# Patient Record
Sex: Male | Born: 1970 | Race: White | Hispanic: No | Marital: Single | Smoking: Never smoker
Health system: Southern US, Community
[De-identification: ages and names within clinical notes are randomized; demographics above are authoritative.]

## PROBLEM LIST (undated history)

## (undated) DIAGNOSIS — F418 Other specified anxiety disorders: Secondary | ICD-10-CM

## (undated) DIAGNOSIS — K649 Unspecified hemorrhoids: Secondary | ICD-10-CM

## (undated) DIAGNOSIS — G4452 New daily persistent headache (NDPH): Secondary | ICD-10-CM

## (undated) HISTORY — DX: Other specified anxiety disorders: F41.8

## (undated) HISTORY — DX: Unspecified hemorrhoids: K64.9

## (undated) HISTORY — DX: New daily persistent headache (ndph): G44.52

## (undated) HISTORY — PX: WISDOM TOOTH EXTRACTION: SHX21

## (undated) HISTORY — PX: LASIK: SHX215

---

## 2000-10-30 HISTORY — PX: LASIK: SHX215

## 2008-08-22 ENCOUNTER — Emergency Department (HOSPITAL_COMMUNITY): Admission: EM | Admit: 2008-08-22 | Discharge: 2008-08-22 | Payer: Self-pay | Admitting: Emergency Medicine

## 2015-04-19 ENCOUNTER — Other Ambulatory Visit: Payer: Self-pay | Admitting: Surgery

## 2015-04-19 NOTE — H&P (Signed)
Noah Collins 04/19/2015 8:40 AM Location: [facility] Patient #: [id number] DOB: May 20, 1971 Single / Language: [place] / Race: White Male History of Present Illness Ardeth Sportsman MD; 04/19/2015 9:22 AM) Patient words: post-op.  The patient is a 44 year old male who presents with anal lesions. Patient sent by his primary care physician, Dr. Beverley Fiedler, for concern of anal tag. Initially going to see Dr. Vida Rigger with [facility] gastroenterology but then redirected to [facility]  Pleasant active male. ? History of warts in the past per [facility] but pt denies this. History of hemorrhoids and fissure in the past. Feels mass near his anus x 6months. Intermittently prolapses - he has to force it back it. Bothersome to him. No severe bleeding. No help with preperation H this time (did in the past) Interested in having it removed. BMs daily. No severe constipation/diarrhea. No history of Crohn's or ulcerative colitis. No fevers or chills. Very sensitive back there. No severe pain. No history of anal fissure or fistula. No history of pilonidal disease. Other Problems Gilmer Mor, CMA; 04/19/2015 8:41 AM) Depression Hemorrhoids  Past Surgical History Gilmer Mor, CMA; 04/19/2015 8:41 AM) No pertinent past surgical history  Diagnostic Studies History Gilmer Mor, CMA; 04/19/2015 8:41 AM) Colonoscopy never  Allergies Lamar Laundry Bynum, CMA; 04/19/2015 8:41 AM) No Known Drug Allergies 04/19/2015  Medication History (Sonya Bynum, CMA; 04/19/2015 8:42 AM) Sertraline HCl (  Tablet, Oral) Active. Medications Reconciled  Social History Gilmer Mor, CMA; 04/19/2015 8:41 AM) Alcohol use Occasional alcohol use. Caffeine use Coffee. No drug use Tobacco use Never smoker.  Family History Gilmer Mor, CMA; 04/19/2015 8:41 AM) First Degree Relatives No pertinent family history     Review of Systems Lamar Laundry Bynum CMA; 04/19/2015 8:41 AM) General Not  Present- Appetite Loss, Chills, Fatigue, Fever, Night Sweats, Weight Gain and Weight Loss. Skin Not Present- Change in Wart/Mole, Dryness, Hives, Jaundice, New Lesions, Non-Healing Wounds, Rash and Ulcer. HEENT Not Present- Earache, Hearing Loss, Hoarseness, Nose Bleed, Oral Ulcers, Ringing in the Ears, Seasonal Allergies, Sinus Pain, Sore Throat, Visual Disturbances, Wears glasses/contact lenses and Yellow Eyes. Respiratory Not Present- Bloody sputum, Chronic Cough, Difficulty Breathing, Snoring and Wheezing. Breast Not Present- Breast Mass, Breast Pain, Nipple Discharge and Skin Changes. Cardiovascular Not Present- Chest Pain, Difficulty Breathing Lying Down, Leg Cramps, Palpitations, Rapid Heart Rate, Shortness of Breath and Swelling of Extremities. Gastrointestinal Present- Hemorrhoids. Not Present- Abdominal Pain, Bloating, Bloody Stool, Change in Bowel Habits, Chronic diarrhea, Constipation, Difficulty Swallowing, Excessive gas, Gets full quickly at meals, Indigestion, Nausea, Rectal Pain and Vomiting. Male Genitourinary Not Present- Blood in Urine, Change in Urinary Stream, Frequency, Impotence, Nocturia, Painful Urination, Urgency and Urine Leakage. Musculoskeletal Not Present- Back Pain, Joint Pain, Joint Stiffness, Muscle Pain, Muscle Weakness and Swelling of Extremities. Neurological Not Present- Decreased Memory, Fainting, Headaches, Numbness, Seizures, Tingling, Tremor, Trouble walking and Weakness. Psychiatric Present- Depression. Not Present- Anxiety, Bipolar, Change in Sleep Pattern, Fearful and Frequent crying. Endocrine Not Present- Cold Intolerance, Excessive Hunger, Hair Changes, Heat Intolerance, Hot flashes and New Diabetes. Hematology Not Present- Easy Bruising, Excessive bleeding, Gland problems, HIV and Persistent Infections.  Vitals (Sonya Bynum CMA; 04/19/2015 8:41 AM) 04/19/2015 8:41 AM Weight: 160 lb Height: 68in Body Surface Area: 1.87 m Body Mass Index: 24.33  kg/m Temp.: 64F(Temporal)  Pulse: 76 (Regular)  BP: 124/74 (Sitting, Left Arm, Standard)     Physical Exam Ardeth Sportsman MD; 04/19/2015 9:14 AM)  General Mental Status-Alert. General Appearance-Not in acute distress, Not Sickly.  Orientation-Oriented X3. Hydration-Well hydrated. Voice-Normal.  Integumentary Global Assessment Upon inspection and palpation of skin surfaces of the - Axillae: non-tender, no inflammation or ulceration, no drainage. and Distribution of scalp and body hair is normal. General Characteristics Temperature - normal warmth is noted.  Head and Neck Head-normocephalic, atraumatic with no lesions or palpable masses. Face Global Assessment - atraumatic, no absence of expression. Neck Global Assessment - no abnormal movements, no bruit auscultated on the right, no bruit auscultated on the left, no decreased range of motion, non-tender. Trachea-midline. Thyroid Gland Characteristics - non-tender.  Eye Eyeball - Left-Extraocular movements intact, No Nystagmus. Eyeball - Right-Extraocular movements intact, No Nystagmus. Cornea - Left-No Hazy. Cornea - Right-No Hazy. Sclera/Conjunctiva - Left-No scleral icterus, No Discharge. Sclera/Conjunctiva - Right-No scleral icterus, No Discharge. Pupil - Left-Direct reaction to light normal. Pupil - Right-Direct reaction to light normal.  ENMT Ears Pinna - Left - no drainage observed, no generalized tenderness observed. Right - no drainage observed, no generalized tenderness observed. Nose and Sinuses External Inspection of the Nose - no destructive lesion observed. Inspection of the nares - Left - quiet respiration. Right - quiet respiration. Mouth and Throat Lips - Upper Lip - no fissures observed, no pallor noted. Lower Lip - no fissures observed, no pallor noted. Nasopharynx - no discharge present. Oral Cavity/Oropharynx - Tongue - no dryness observed. Oral Mucosa - no  cyanosis observed. Hypopharynx - no evidence of airway distress observed.  Chest and Lung Exam Inspection Movements - Normal and Symmetrical. Accessory muscles - No use of accessory muscles in breathing. Palpation Palpation of the chest reveals - Non-tender. Auscultation Breath sounds - Normal and Clear.  Cardiovascular Auscultation Rhythm - Regular. Murmurs & Other Heart Sounds - Auscultation of the heart reveals - No Murmurs and No Systolic Clicks.  Abdomen Inspection Inspection of the abdomen reveals - No Visible peristalsis and No Abnormal pulsations. Umbilicus - No Bleeding, No Urine drainage. Palpation/Percussion Palpation and Percussion of the abdomen reveal - Soft, Non Tender, No Rebound tenderness, No Rigidity (guarding) and No Cutaneous hyperesthesia. Note: Soft and flat. No diastases. No umbilical hernia   Male Genitourinary Sexual Maturity Tanner 5 - Adult hair pattern and Adult penile size and shape. Note: Perianal skin clean. No pruritis. Mild hair burden. No pilonidal disease. Subtle posterior midline anal scar but no definite fissure. RIGHT lateral anal fold. Obvious right posterior pedunculauted mass on digital exam and seen on anoscopy consistent with anal canal polyp. Can prolapse easily. Mild grade 2 hemorrhoids right anterior & left lateral   Peripheral Vascular Upper Extremity Inspection - Left - No Cyanotic nailbeds, Not Ischemic. Right - No Cyanotic nailbeds, Not Ischemic.  Neurologic Neurologic evaluation reveals -normal attention span and ability to concentrate, able to name objects and repeat phrases. Appropriate fund of knowledge , normal sensation and normal coordination. Mental Status Affect - not angry, not paranoid. Cranial Nerves-Normal Bilaterally. Gait-Normal.  Neuropsychiatric Mental status exam performed with findings of-able to articulate well with normal speech/language, rate, volume and coherence, thought content normal with  ability to perform basic computations and apply abstract reasoning and no evidence of hallucinations, delusions, obsessions or homicidal/suicidal ideation.  Musculoskeletal Global Assessment Spine, Ribs and Pelvis - no instability, subluxation or laxity. Right Upper Extremity - no instability, subluxation or laxity.  Lymphatic Head & Neck  General Head & Neck Lymphatics: Bilateral - Description - No Localized lymphadenopathy. Axillary  General Axillary Region: Bilateral - Description - No Localized lymphadenopathy. Femoral & Inguinal  Generalized Femoral &  Inguinal Lymphatics: Left - Description - No Localized lymphadenopathy. Right - Description - No Localized lymphadenopathy.    Assessment & Plan Ardeth Sportsman MD; 04/19/2015 9:22 AM)  ANAL POLYP (569.0  K62.0) Impression: Anal canal mass prolapsing out that requires been pushed back in. Grade 3. Sensitive and causing discomfort. I think he would benefit from removal. I think he is far too sensitive to try and remove the office. We'll plan outpatient procedure under anesthesia. Prone.  Current Plans Schedule for Surgery Pt Education - [facility] Hemorrhoids (Danell Verno) Pt Education - [facility] Rectal Surgery HCI (Sarai January): discussed with patient and provided information. Pt Education - [facility] Pelvic Floor Exercises (Kegels) and Dysfunction HCI (Keedan Sample) Pt Education - [facility] Pain Control (Alaa Mullally) The anatomy & physiology of the anorectal region was discussed. The pathophysiology of hemorrhoids and differential diagnosis was discussed. Natural history risks without surgery was discussed. I stressed the importance of a bowel regimen to have daily soft bowel movements to minimize progression of disease. Interventions such as sclerotherapy & banding were discussed.  The patient's symptoms are not adequately controlled by medicines and other non-operative treatments. I feel the risks & problems of no surgery outweigh the operative risks; therefore, I recommended  surgery to treat the hemorrhoids by ligation, pexy, and possible resection.  Risks such as bleeding, infection, urinary difficulties, need for further treatment, heart attack, death, and other risks were discussed. I noted a good likelihood this will help address the problem. Goals of post-operative recovery were discussed as well. Possibility that this will not correct all symptoms was explained. Post-operative pain, bleeding, constipation, and other problems after surgery were discussed. We will work to minimize complications. Educational handouts further explaining the pathology, treatment options, and bowel regimen were given as well. Questions were answered. The patient expresses understanding & wishes to proceed with surgery.  Ardeth Sportsman, M.D., F.A.C.S. Gastrointestinal and Minimally Invasive Surgery [facility] [address] [place], [place] [address] [phone] Main / Paging

## 2016-01-31 DIAGNOSIS — S63285D Dislocation of proximal interphalangeal joint of left ring finger, subsequent encounter: Secondary | ICD-10-CM | POA: Diagnosis not present

## 2016-01-31 DIAGNOSIS — M79645 Pain in left finger(s): Secondary | ICD-10-CM | POA: Diagnosis not present

## 2016-01-31 DIAGNOSIS — M24542 Contracture, left hand: Secondary | ICD-10-CM | POA: Diagnosis not present

## 2016-02-07 DIAGNOSIS — M24542 Contracture, left hand: Secondary | ICD-10-CM | POA: Diagnosis not present

## 2016-02-07 DIAGNOSIS — S63285D Dislocation of proximal interphalangeal joint of left ring finger, subsequent encounter: Secondary | ICD-10-CM | POA: Diagnosis not present

## 2016-02-07 DIAGNOSIS — M79645 Pain in left finger(s): Secondary | ICD-10-CM | POA: Diagnosis not present

## 2016-02-11 DIAGNOSIS — S63285S Dislocation of proximal interphalangeal joint of left ring finger, sequela: Secondary | ICD-10-CM | POA: Diagnosis not present

## 2016-02-11 DIAGNOSIS — S63285D Dislocation of proximal interphalangeal joint of left ring finger, subsequent encounter: Secondary | ICD-10-CM | POA: Diagnosis not present

## 2016-02-11 DIAGNOSIS — M24542 Contracture, left hand: Secondary | ICD-10-CM | POA: Diagnosis not present

## 2016-02-18 DIAGNOSIS — S63285S Dislocation of proximal interphalangeal joint of left ring finger, sequela: Secondary | ICD-10-CM | POA: Diagnosis not present

## 2016-02-18 DIAGNOSIS — M24542 Contracture, left hand: Secondary | ICD-10-CM | POA: Diagnosis not present

## 2016-02-24 DIAGNOSIS — S63285D Dislocation of proximal interphalangeal joint of left ring finger, subsequent encounter: Secondary | ICD-10-CM | POA: Diagnosis not present

## 2016-02-24 DIAGNOSIS — M24542 Contracture, left hand: Secondary | ICD-10-CM | POA: Diagnosis not present

## 2016-02-24 DIAGNOSIS — S63285S Dislocation of proximal interphalangeal joint of left ring finger, sequela: Secondary | ICD-10-CM | POA: Diagnosis not present

## 2016-02-24 DIAGNOSIS — M79645 Pain in left finger(s): Secondary | ICD-10-CM | POA: Diagnosis not present

## 2016-03-02 DIAGNOSIS — S63285D Dislocation of proximal interphalangeal joint of left ring finger, subsequent encounter: Secondary | ICD-10-CM | POA: Diagnosis not present

## 2016-03-02 DIAGNOSIS — S63285S Dislocation of proximal interphalangeal joint of left ring finger, sequela: Secondary | ICD-10-CM | POA: Diagnosis not present

## 2016-03-02 DIAGNOSIS — M79645 Pain in left finger(s): Secondary | ICD-10-CM | POA: Diagnosis not present

## 2016-03-02 DIAGNOSIS — M24542 Contracture, left hand: Secondary | ICD-10-CM | POA: Diagnosis not present

## 2016-03-13 DIAGNOSIS — S63285S Dislocation of proximal interphalangeal joint of left ring finger, sequela: Secondary | ICD-10-CM | POA: Diagnosis not present

## 2016-03-13 DIAGNOSIS — M24542 Contracture, left hand: Secondary | ICD-10-CM | POA: Diagnosis not present

## 2016-03-21 DIAGNOSIS — M25642 Stiffness of left hand, not elsewhere classified: Secondary | ICD-10-CM | POA: Diagnosis not present

## 2016-03-31 DIAGNOSIS — F418 Other specified anxiety disorders: Secondary | ICD-10-CM | POA: Diagnosis not present

## 2016-04-25 DIAGNOSIS — M24542 Contracture, left hand: Secondary | ICD-10-CM | POA: Diagnosis not present

## 2016-04-25 DIAGNOSIS — S63285S Dislocation of proximal interphalangeal joint of left ring finger, sequela: Secondary | ICD-10-CM | POA: Diagnosis not present

## 2016-07-24 DIAGNOSIS — N529 Male erectile dysfunction, unspecified: Secondary | ICD-10-CM | POA: Diagnosis not present

## 2016-08-21 DIAGNOSIS — Z23 Encounter for immunization: Secondary | ICD-10-CM | POA: Diagnosis not present

## 2016-08-21 DIAGNOSIS — L729 Follicular cyst of the skin and subcutaneous tissue, unspecified: Secondary | ICD-10-CM | POA: Diagnosis not present

## 2016-10-18 DIAGNOSIS — L723 Sebaceous cyst: Secondary | ICD-10-CM | POA: Diagnosis not present

## 2016-10-18 DIAGNOSIS — Z302 Encounter for sterilization: Secondary | ICD-10-CM | POA: Diagnosis not present

## 2016-10-30 HISTORY — PX: VASECTOMY: SHX75

## 2016-10-30 HISTORY — PX: SHOULDER ARTHROSCOPY: SHX128

## 2016-11-22 DIAGNOSIS — Z302 Encounter for sterilization: Secondary | ICD-10-CM | POA: Diagnosis not present

## 2016-11-22 DIAGNOSIS — L723 Sebaceous cyst: Secondary | ICD-10-CM | POA: Diagnosis not present

## 2016-11-27 DIAGNOSIS — F4323 Adjustment disorder with mixed anxiety and depressed mood: Secondary | ICD-10-CM | POA: Diagnosis not present

## 2017-01-05 DIAGNOSIS — Z0000 Encounter for general adult medical examination without abnormal findings: Secondary | ICD-10-CM | POA: Diagnosis not present

## 2017-01-17 DIAGNOSIS — R740 Nonspecific elevation of levels of transaminase and lactic acid dehydrogenase [LDH]: Secondary | ICD-10-CM | POA: Diagnosis not present

## 2017-02-28 DIAGNOSIS — R3915 Urgency of urination: Secondary | ICD-10-CM | POA: Diagnosis not present

## 2017-03-02 DIAGNOSIS — R3915 Urgency of urination: Secondary | ICD-10-CM | POA: Diagnosis not present

## 2017-03-22 DIAGNOSIS — M19011 Primary osteoarthritis, right shoulder: Secondary | ICD-10-CM | POA: Diagnosis not present

## 2017-05-14 DIAGNOSIS — M19011 Primary osteoarthritis, right shoulder: Secondary | ICD-10-CM | POA: Diagnosis not present

## 2017-06-05 DIAGNOSIS — M7521 Bicipital tendinitis, right shoulder: Secondary | ICD-10-CM | POA: Diagnosis not present

## 2017-06-05 DIAGNOSIS — M24111 Other articular cartilage disorders, right shoulder: Secondary | ICD-10-CM | POA: Diagnosis not present

## 2017-06-05 DIAGNOSIS — G8918 Other acute postprocedural pain: Secondary | ICD-10-CM | POA: Diagnosis not present

## 2017-06-05 DIAGNOSIS — M7541 Impingement syndrome of right shoulder: Secondary | ICD-10-CM | POA: Diagnosis not present

## 2017-06-05 DIAGNOSIS — S43431A Superior glenoid labrum lesion of right shoulder, initial encounter: Secondary | ICD-10-CM | POA: Diagnosis not present

## 2017-06-05 DIAGNOSIS — I890 Lymphedema, not elsewhere classified: Secondary | ICD-10-CM | POA: Diagnosis not present

## 2017-06-05 DIAGNOSIS — M19011 Primary osteoarthritis, right shoulder: Secondary | ICD-10-CM | POA: Diagnosis not present

## 2017-06-05 DIAGNOSIS — M25511 Pain in right shoulder: Secondary | ICD-10-CM | POA: Diagnosis not present

## 2017-07-04 DIAGNOSIS — Q559 Congenital malformation of male genital organ, unspecified: Secondary | ICD-10-CM | POA: Diagnosis not present

## 2017-07-05 DIAGNOSIS — R29898 Other symptoms and signs involving the musculoskeletal system: Secondary | ICD-10-CM | POA: Diagnosis not present

## 2018-01-04 DIAGNOSIS — H6590 Unspecified nonsuppurative otitis media, unspecified ear: Secondary | ICD-10-CM | POA: Diagnosis not present

## 2018-01-04 DIAGNOSIS — Z0000 Encounter for general adult medical examination without abnormal findings: Secondary | ICD-10-CM | POA: Diagnosis not present

## 2018-02-19 DIAGNOSIS — L281 Prurigo nodularis: Secondary | ICD-10-CM | POA: Diagnosis not present

## 2018-02-19 DIAGNOSIS — B070 Plantar wart: Secondary | ICD-10-CM | POA: Diagnosis not present

## 2018-02-19 DIAGNOSIS — F418 Other specified anxiety disorders: Secondary | ICD-10-CM | POA: Diagnosis not present

## 2018-03-19 DIAGNOSIS — M216X2 Other acquired deformities of left foot: Secondary | ICD-10-CM | POA: Diagnosis not present

## 2018-04-03 DIAGNOSIS — L708 Other acne: Secondary | ICD-10-CM | POA: Diagnosis not present

## 2018-06-07 DIAGNOSIS — L708 Other acne: Secondary | ICD-10-CM | POA: Diagnosis not present

## 2018-07-08 DIAGNOSIS — K4090 Unilateral inguinal hernia, without obstruction or gangrene, not specified as recurrent: Secondary | ICD-10-CM | POA: Diagnosis not present

## 2018-07-08 DIAGNOSIS — R102 Pelvic and perineal pain: Secondary | ICD-10-CM | POA: Diagnosis not present

## 2018-07-18 DIAGNOSIS — F418 Other specified anxiety disorders: Secondary | ICD-10-CM | POA: Diagnosis not present

## 2018-07-30 DIAGNOSIS — F4323 Adjustment disorder with mixed anxiety and depressed mood: Secondary | ICD-10-CM | POA: Diagnosis not present

## 2018-08-05 DIAGNOSIS — Z9889 Other specified postprocedural states: Secondary | ICD-10-CM | POA: Diagnosis not present

## 2018-08-14 DIAGNOSIS — F4323 Adjustment disorder with mixed anxiety and depressed mood: Secondary | ICD-10-CM | POA: Diagnosis not present

## 2018-08-15 DIAGNOSIS — F418 Other specified anxiety disorders: Secondary | ICD-10-CM | POA: Diagnosis not present

## 2018-08-21 DIAGNOSIS — F4323 Adjustment disorder with mixed anxiety and depressed mood: Secondary | ICD-10-CM | POA: Diagnosis not present

## 2018-08-24 ENCOUNTER — Emergency Department (HOSPITAL_COMMUNITY)
Admission: EM | Admit: 2018-08-24 | Discharge: 2018-08-24 | Disposition: A | Discharge status: Home / Self Care | Payer: BLUE CROSS/BLUE SHIELD | Attending: Emergency Medicine | Admitting: Emergency Medicine

## 2018-08-24 ENCOUNTER — Encounter (HOSPITAL_COMMUNITY): Payer: Self-pay | Admitting: Emergency Medicine

## 2018-08-24 DIAGNOSIS — Z9889 Other specified postprocedural states: Secondary | ICD-10-CM

## 2018-08-24 DIAGNOSIS — H539 Unspecified visual disturbance: Secondary | ICD-10-CM | POA: Diagnosis not present

## 2018-08-24 DIAGNOSIS — G8918 Other acute postprocedural pain: Secondary | ICD-10-CM | POA: Insufficient documentation

## 2018-08-24 DIAGNOSIS — H5712 Ocular pain, left eye: Secondary | ICD-10-CM | POA: Diagnosis not present

## 2018-08-24 MED ORDER — KETOROLAC TROMETHAMINE 60 MG/2ML IM SOLN
60.0000 mg | Freq: Once | INTRAMUSCULAR | Status: AC
  Administered 2018-08-24: 60 mg via INTRAMUSCULAR
  Filled 2018-08-24: qty 2

## 2018-08-24 MED ORDER — OXYCODONE HCL 5 MG PO TABS
5.0000 mg | ORAL_TABLET | Freq: Once | ORAL | Status: AC
  Administered 2018-08-24: 5 mg via ORAL
  Filled 2018-08-24: qty 1

## 2018-08-24 MED ORDER — TETRACAINE HCL 0.5 % OP SOLN
2.0000 [drp] | Freq: Once | OPHTHALMIC | Status: AC
  Administered 2018-08-24: 2 [drp] via OPHTHALMIC
  Filled 2018-08-24: qty 4

## 2018-08-24 MED ORDER — OXYCODONE HCL 5 MG PO TABS: 5.0000 mg | ORAL_TABLET | ORAL | 0 refills | Status: DC | PRN

## 2018-08-24 NOTE — ED Triage Notes (Signed)
Pt reports that he had Lasik eye surgery on Thursday and had a contact lens put in for 5 day but today contact came out and now having sharp stabbing pains and reports cant see out of it.

## 2018-08-24 NOTE — ED Provider Notes (Signed)
[facility]-EMERGENCY DEPT Provider Note   CSN: [id number] Arrival date & time: 08/24/18  1818     History   Chief Complaint Chief Complaint  Patient presents with  . Eye Pain    HPI Stacie Knutzen is a 47 y.o. male.  HPI   Thursday had retouch surgery, lasik Dr. Delaney Meigs  Contact lens to protect it, since it was removed having severe pain, feels like a needle in eye, blurred vision  10/10 pain   History reviewed. No pertinent past medical history.  There are no active problems to display for this patient.   Past Surgical History:  Procedure Laterality Date  . LASIK          Home Medications    Prior to Admission medications   Medication Sig Start Date End Date Taking? Authorizing Provider  Ascorbic Acid (VITAMIN C GUMMIES PO) Take 1 tablet by mouth daily.   Yes [provider]  citalopram (CELEXA) 20 MG tablet Take 20 mg by mouth daily. 08/08/18  Yes [provider]  fluorometholone (FML) 0.1 % ophthalmic suspension Place 1 drop into the left eye 4 (four) times daily. 08/21/18  Yes [provider]  gatifloxacin (ZYMAXID) 0.5 % SOLN Place 1 drop into the left eye 3 (three) times daily. 08/21/18  Yes [provider]  Multiple Vitamin (MULTIVITAMIN WITH MINERALS) TABS tablet Take 1 tablet by mouth daily.   Yes [provider]  oxyCODONE-acetaminophen (PERCOCET/ROXICET) 5-325 MG tablet Take 1 tablet by mouth every 4 (four) hours as needed for pain. 08/21/18  Yes [provider]  zolpidem (AMBIEN) 10 MG tablet Take 10 mg by mouth at bedtime. 08/21/18  Yes [provider]  oxyCODONE (ROXICODONE) 5 MG immediate release tablet Take 1-2 tablets (5-10 mg total) by mouth every 4 (four) hours as needed for severe pain. 08/24/18   Alvira Monday, MD    Family History No family history on file.  Social History Social History   Tobacco Use  . Smoking status: Never Smoker  . Smokeless  tobacco: Never Used  Substance Use Topics  . Alcohol use: Not Currently  . Drug use: Not Currently     Allergies   Patient has no known allergies.   Review of Systems Review of Systems  Constitutional: Negative for fever.  HENT: Negative for sore throat.   Eyes: Positive for pain and visual disturbance. Negative for photophobia.  Gastrointestinal: Negative for nausea and vomiting.  Skin: Negative for rash.  Neurological: Negative for syncope and headaches.     Physical Exam Updated Vital Signs BP 119/76   Pulse 68   Temp 98 F (36.7 C) (Oral)   Resp 18   Ht 5\' 9"  (1.753 m)   Wt 72.6 kg   SpO2 97%   BMI 23.63 kg/m   Physical Exam  Constitutional: He is oriented to person, place, and time. He appears well-developed and well-nourished. He appears distressed.  HENT:  Head: Normocephalic and atraumatic.  Eyes: Pupils are equal, round, and reactive to light. EOM are normal. Left eye discharge: tearing. Left conjunctiva is injected.  Neck: Normal range of motion.  Cardiovascular: Normal rate, regular rhythm and intact distal pulses.  Pulmonary/Chest: Effort normal. No respiratory distress.  Abdominal: Soft. He exhibits no distension. There is no tenderness. There is no guarding.  Musculoskeletal: He exhibits no edema.  Neurological: He is alert and oriented to person, place, and time.  Skin: Skin is warm and dry. He is not diaphoretic.  Nursing  note and vitals reviewed.    ED Treatments / Results  Labs (all labs ordered are listed, but only abnormal results are displayed) Labs Reviewed - No data to display  EKG None  Radiology No results found.  Procedures Procedures (including critical care time)  Medications Ordered in ED Medications  tetracaine (PONTOCAINE) 0.5 % ophthalmic solution 2 drop (2 drops Left Eye Given 08/24/18 1846)  tetracaine (PONTOCAINE) 0.5 % ophthalmic solution 2 drop (2 drops Left Eye Given 08/24/18 2114)  ketorolac (TORADOL)  injection 60 mg (60 mg Intramuscular Given 08/24/18 2203)  oxyCODONE (Oxy IR/ROXICODONE) immediate release tablet 5 mg (5 mg Oral Given 08/24/18 2344)     Initial Impression / Assessment and Plan / ED Course  I have reviewed the triage vital signs and the nursing notes.  Pertinent labs & imaging results that were available during my care of the patient were reviewed by me and considered in my medical decision making (see chart for details).     47 year old male with a history of Lasik, suspect photoreactive keratectomy with Dr. Delaney Meigs on Thursday, who presents with severe, stabbing left eye pain and blurred vision after he removes bandage contact lens that had begun to fall out of his eye.  Called ophthalmology, on-call physician for Dr. Ronnie Doss office, and we were initially told it would be potentially greater than 2 hours before you hear back, then discussed with ophthalmology on-call for [facility], however Dr. Clista Bernhardt or Dr. Ronnie Doss office, called back and reports he will get in touch with Dr. Delaney Meigs tonight for placement of bandage contact lens in the morning.  The pain after removal of the bandage contact lens is expected, and is severe.  He recommends not high pain medication to use throughout the night.  In the emergency department, he was given Toradol, tetracaine, and 1 dose of oxycodone prior to discharge.  Provided patient with Dr. Ceasar Lund number.  Final Clinical Impressions(s) / ED Diagnoses   Final diagnoses:  Status post advanced surface ablation photorefractive keratectomy [facility])    ED Discharge Orders         Ordered    oxyCODONE (ROXICODONE) 5 MG immediate release tablet  Every 4 hours PRN     08/24/18 2338           Alvira Monday, MD 08/25/18 1437

## 2018-08-24 NOTE — ED Notes (Signed)
Pt given ice pack as requested

## 2018-09-05 ENCOUNTER — Ambulatory Visit (INDEPENDENT_AMBULATORY_CARE_PROVIDER_SITE_OTHER): Payer: BLUE CROSS/BLUE SHIELD | Admitting: Psychiatry

## 2018-09-05 ENCOUNTER — Encounter: Payer: Self-pay | Admitting: Psychiatry

## 2018-09-05 VITALS — BP 125/67 | HR 67

## 2018-09-05 DIAGNOSIS — F3289 Other specified depressive episodes: Secondary | ICD-10-CM

## 2018-09-05 DIAGNOSIS — F419 Anxiety disorder, unspecified: Secondary | ICD-10-CM

## 2018-09-05 MED ORDER — MIRTAZAPINE 15 MG PO TABS: 15.0000 mg | ORAL_TABLET | Freq: Every day | ORAL | 1 refills | Status: DC

## 2018-09-05 NOTE — Progress Notes (Signed)
[facility] MD/PA/NP Initial Note  09/05/2018 1:01 PM Noah Collins  MRN:  [id number]  Chief Complaint:  depression   HPI: Patient is a 47 year old white male.  Seen today for depression anxiety.  Patient's depression started 5 years ago.  It started after his divorce. he was okay in 3 to 6 months.  He had intermittent depression lasting several days since then.  This past August his girlfriend left him and it was  the worst depression she is ever had. he had lost weight decreased appetite decreased exercise insomnia decreased energy and motivation and anhedonia, hard to get out of bed.  He did have passive suicidal thoughts at that time it is the only time he is ever had it.  No suicidal plan.  Currently he is doing much better but still has waves of depression lasting several days at a time.  Denies manic symptoms.  No psychosis.  No OCD.  Anxiety intermittently.  Visit Diagnosis: depression, anxiety  Past Psychiatric History:   Past Medical History: negative Past Surgical History:  Procedure Laterality Date  . LASIK      Family Psychiatric History: negative  Family History: negative  Social History:  Divorced with a 8 yr. Old daughter. He doesn't smoke, drink etoh, and doesn;t use drugs  Allergies: No Known Allergies  Metabolic Disorder Labs: No results found for: HGBA1C, MPG No results found for: PROLACTIN No results found for: CHOL, TRIG, HDL, CHOLHDL, VLDL, LDLCALC No results found for: TSH  Therapeutic Level Labs: No results found for: LITHIUM No results found for: VALPROATE No components found for:  CBMZ  Current Medications: Current Outpatient Medications  Medication Sig Dispense Refill  . Ascorbic Acid (VITAMIN C GUMMIES PO) Take 1 tablet by mouth daily.    . citalopram (CELEXA) 20 MG tablet Take 20 mg by mouth daily.  1  . fluorometholone (FML) 0.1 % ophthalmic suspension Place 1 drop into the left eye 4 (four) times daily.  2  . gatifloxacin (ZYMAXID) 0.5 %  SOLN Place 1 drop into the left eye 3 (three) times daily.  0  . Multiple Vitamin (MULTIVITAMIN WITH MINERALS) TABS tablet Take 1 tablet by mouth daily.    Marland Kitchen oxyCODONE (ROXICODONE) 5 MG immediate release tablet Take 1-2 tablets (5-10 mg total) by mouth every 4 (four) hours as needed for severe pain. 5 tablet 0  . oxyCODONE-acetaminophen (PERCOCET/ROXICET) 5-325 MG tablet Take 1 tablet by mouth every 4 (four) hours as needed for pain.  0  . zolpidem (AMBIEN) 10 MG tablet Take 10 mg by mouth at bedtime.  0   No current facility-administered medications for this visit.     Medication Side Effects: none  Orders placed this visit:  Increase celexa to 30 mg/day. Continue remeron 15 mg/hs. Xanax 0.25 1-2 prn. 30 tablets given.  Psychiatric Specialty Exam:  ROS negative  Wt 162 lbs.  Height 5'9"  General Appearance: Neat  Eye Contact:  Good  Speech:  Clear and Coherent  Volume:  Normal  Mood:  Euthymic  Affect:  Appropriate  Thought Process:  Linear  Orientation:  Full (Time, Place, and Person)  Thought Content: Logical   Suicidal Thoughts:  No  Homicidal Thoughts:  No  Memory:  normal  Judgement:  Good  Insight:  Good  Psychomotor Activity:  Normal  Concentration:  Concentration: Good  Recall:  Good  Fund of Knowledge: Good  Language: Good  Assets:  Desire for Improvement  ADL's:  Intact  Cognition: WNL  Prognosis:  Good   Screenings: mdq-negative  Receiving Psychotherapy: No  had been seeing Engineer, manufacturing systems  Treatment Plan/Recommendations: Is to increase his Celexa to 30 mg a day.  Given a prescription for Remeron 15 mg a day.  Given a prescription for Xanax 0.25 mg 1-2 as needed 30 tablets given. Prescriptions were hand written.    Anne Fu, PA-C

## 2018-09-11 ENCOUNTER — Other Ambulatory Visit: Payer: Self-pay

## 2018-09-11 MED ORDER — MIRTAZAPINE 15 MG PO TABS: 15.0000 mg | ORAL_TABLET | Freq: Every day | ORAL | 0 refills | Status: DC

## 2018-10-03 ENCOUNTER — Ambulatory Visit: Payer: BLUE CROSS/BLUE SHIELD | Admitting: Psychiatry

## 2018-10-30 HISTORY — PX: KNEE ARTHROSCOPY: SUR90

## 2018-11-08 DIAGNOSIS — F418 Other specified anxiety disorders: Secondary | ICD-10-CM | POA: Diagnosis not present

## 2018-12-18 DIAGNOSIS — Z0000 Encounter for general adult medical examination without abnormal findings: Secondary | ICD-10-CM | POA: Diagnosis not present

## 2019-05-20 DIAGNOSIS — M1712 Unilateral primary osteoarthritis, left knee: Secondary | ICD-10-CM | POA: Diagnosis not present

## 2019-07-18 DIAGNOSIS — M25562 Pain in left knee: Secondary | ICD-10-CM | POA: Diagnosis not present

## 2019-07-24 DIAGNOSIS — S83242A Other tear of medial meniscus, current injury, left knee, initial encounter: Secondary | ICD-10-CM | POA: Diagnosis not present

## 2019-08-01 DIAGNOSIS — S83242A Other tear of medial meniscus, current injury, left knee, initial encounter: Secondary | ICD-10-CM | POA: Diagnosis not present

## 2019-08-01 DIAGNOSIS — M23352 Other meniscus derangements, posterior horn of lateral meniscus, left knee: Secondary | ICD-10-CM | POA: Diagnosis not present

## 2019-08-14 DIAGNOSIS — M23304 Other meniscus derangements, unspecified medial meniscus, left knee: Secondary | ICD-10-CM | POA: Diagnosis not present

## 2019-09-28 DIAGNOSIS — Z20828 Contact with and (suspected) exposure to other viral communicable diseases: Secondary | ICD-10-CM | POA: Diagnosis not present

## 2019-09-28 DIAGNOSIS — R519 Headache, unspecified: Secondary | ICD-10-CM | POA: Diagnosis not present

## 2019-09-28 DIAGNOSIS — J029 Acute pharyngitis, unspecified: Secondary | ICD-10-CM | POA: Diagnosis not present

## 2019-09-29 DIAGNOSIS — G4452 New daily persistent headache (NDPH): Secondary | ICD-10-CM | POA: Diagnosis not present

## 2019-09-29 DIAGNOSIS — Z20828 Contact with and (suspected) exposure to other viral communicable diseases: Secondary | ICD-10-CM | POA: Diagnosis not present

## 2019-10-04 DIAGNOSIS — Z20828 Contact with and (suspected) exposure to other viral communicable diseases: Secondary | ICD-10-CM | POA: Diagnosis not present

## 2019-11-05 ENCOUNTER — Ambulatory Visit (INDEPENDENT_AMBULATORY_CARE_PROVIDER_SITE_OTHER): Payer: BC Managed Care – PPO | Admitting: Psychiatry

## 2019-11-05 ENCOUNTER — Other Ambulatory Visit: Payer: Self-pay

## 2019-11-05 DIAGNOSIS — F4010 Social phobia, unspecified: Secondary | ICD-10-CM | POA: Diagnosis not present

## 2019-11-05 DIAGNOSIS — F331 Major depressive disorder, recurrent, moderate: Secondary | ICD-10-CM
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
## 2019-11-19 ENCOUNTER — Ambulatory Visit (INDEPENDENT_AMBULATORY_CARE_PROVIDER_SITE_OTHER): Payer: BC Managed Care – PPO | Admitting: Psychiatry

## 2019-11-19 ENCOUNTER — Ambulatory Visit: Payer: Self-pay | Admitting: Neurology

## 2019-11-19 ENCOUNTER — Other Ambulatory Visit: Payer: Self-pay

## 2019-11-19 DIAGNOSIS — F331 Major depressive disorder, recurrent, moderate: Secondary | ICD-10-CM

## 2019-11-19 DIAGNOSIS — F4010 Social phobia, unspecified: Secondary | ICD-10-CM

## 2019-11-19 DIAGNOSIS — F341 Dysthymic disorder: Secondary | ICD-10-CM
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
## 2019-12-03 ENCOUNTER — Ambulatory Visit (INDEPENDENT_AMBULATORY_CARE_PROVIDER_SITE_OTHER): Payer: BC Managed Care – PPO | Admitting: Psychiatry

## 2019-12-03 ENCOUNTER — Other Ambulatory Visit: Payer: Self-pay

## 2019-12-03 DIAGNOSIS — F4010 Social phobia, unspecified: Secondary | ICD-10-CM | POA: Diagnosis not present

## 2019-12-03 DIAGNOSIS — F341 Dysthymic disorder: Secondary | ICD-10-CM

## 2019-12-03 DIAGNOSIS — F331 Major depressive disorder, recurrent, moderate: Secondary | ICD-10-CM
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
## 2019-12-24 ENCOUNTER — Ambulatory Visit: Payer: BC Managed Care – PPO | Admitting: Psychiatry

## 2020-01-06 ENCOUNTER — Ambulatory Visit: Payer: BC Managed Care – PPO | Admitting: Psychiatry

## 2020-01-19 ENCOUNTER — Encounter: Payer: Self-pay | Admitting: *Deleted

## 2020-01-20 ENCOUNTER — Ambulatory Visit (INDEPENDENT_AMBULATORY_CARE_PROVIDER_SITE_OTHER): Payer: BC Managed Care – PPO | Admitting: Diagnostic Neuroimaging

## 2020-01-20 ENCOUNTER — Other Ambulatory Visit: Payer: Self-pay

## 2020-01-20 ENCOUNTER — Encounter: Payer: Self-pay | Admitting: Diagnostic Neuroimaging

## 2020-01-20 VITALS — BP 113/60 | HR 55 | Temp 97.2°F | Ht 69.0 in | Wt 171.6 lb

## 2020-01-20 DIAGNOSIS — G4452 New daily persistent headache (NDPH): Secondary | ICD-10-CM | POA: Diagnosis not present

## 2020-01-20 NOTE — Patient Instructions (Signed)
NEW ONSET DAILY HEADACHES (? Tension vs other secondary HA) - check MRI brain  - To prevent or relieve headaches, try the following:  Cool Compress. Lie down and place a cool compress on your head.   Avoid headache triggers. If certain foods or odors seem to have triggered your migraines in the past, avoid them. A headache diary might help you identify triggers.   Include physical activity in your daily routine.   Manage stress. Find healthy ways to cope with the stressors, such as delegating tasks on your to-do list.   Practice relaxation techniques. Try deep breathing, yoga, massage and visualization.   Eat regularly. Eating regularly scheduled meals and maintaining a healthy diet might help prevent headaches. Also, drink plenty of fluids.   Follow a regular sleep schedule. Sleep deprivation might contribute to headaches  Consider biofeedback. With this mind-body technique, you learn to control certain bodily functions -- such as muscle tension, heart rate and blood pressure -- to prevent headaches or reduce headache pain.

## 2020-01-20 NOTE — Progress Notes (Signed)
[facility]  PATIENT: Noah Collins DOB: 15-Aug-1971  REFERRING CLINICIAN: Rankins, Fanny Dance, MD HISTORY FROM: patient  REASON FOR VISIT: new consult    HISTORICAL  CHIEF COMPLAINT:  Chief Complaint  Patient presents with  . Headache    rm 7 New Pt "generalized headaches began 3 months ago, tried lots of OTC meds with little to no relief"    HISTORY OF PRESENT ILLNESS:   49 year old male here for evaluation of headaches.  Symptoms started in December 2020.  Patient describes global, general dull and aching headaches, lasting all day.  Patient may wake up with a headache and go to sleep with a headache.  Symptoms are worse when he is less active.  Vigorous physical activity, staying busy at work or with activities seems to lessen the headache.  No nausea or vomiting.  No sensitive light or sound.  No dizziness, numbness, vision changes.  No warning symptoms or headache.  No prior similar headaches.  No triggering or aggravating factors.  Patient denies any significant stress, anxiety or depression.  Has tried over-the-counter medications without relief.    REVIEW OF SYSTEMS: Full 14 system review of systems performed and negative with exception of: As per HPI.  ALLERGIES: No Active Allergies  HOME MEDICATIONS: Outpatient Medications Prior to Visit  Medication Sig Dispense Refill  . citalopram (CELEXA) 20 MG tablet Take 20 mg by mouth daily.  1  . Multiple Vitamin (MULTIVITAMIN WITH MINERALS) TABS tablet Take 1 tablet by mouth daily.    . Ascorbic Acid (VITAMIN C GUMMIES PO) Take 1 tablet by mouth daily.    . fluorometholone (FML) 0.1 % ophthalmic suspension Place 1 drop into the left eye 4 (four) times daily.  2  . gatifloxacin (ZYMAXID) 0.5 % SOLN Place 1 drop into the left eye 3 (three) times daily.  0  . mirtazapine (REMERON) 15 MG tablet Take 1 tablet (15 mg total) by mouth at bedtime. 90 tablet 0  . oxyCODONE (ROXICODONE) 5 MG immediate release tablet  Take 1-2 tablets (5-10 mg total) by mouth every 4 (four) hours as needed for severe pain. (Patient not taking: Reported on 09/05/2018) 5 tablet 0  . oxyCODONE-acetaminophen (PERCOCET/ROXICET) 5-325 MG tablet Take 1 tablet by mouth every 4 (four) hours as needed for pain.  0  . zolpidem (AMBIEN) 10 MG tablet Take 10 mg by mouth at bedtime.  0   No facility-administered medications prior to visit.    PAST MEDICAL HISTORY: Past Medical History:  Diagnosis Date  . Depression with anxiety   . Hemorrhoid    hx of  . New daily persistent headache     PAST SURGICAL HISTORY: Past Surgical History:  Procedure Laterality Date  . KNEE ARTHROSCOPY Left 2020   meniscus  . LASIK  2002  . SHOULDER ARTHROSCOPY  2018  . VASECTOMY  10/2016  . WISDOM TOOTH EXTRACTION      FAMILY HISTORY: Family History  Problem Relation Age of Onset  . Hypertension Mother   . Anxiety disorder Mother   . Alzheimer's disease Maternal Grandmother   . Alzheimer's disease Paternal Grandmother   . Lung cancer Paternal Uncle     SOCIAL HISTORY: Social History   Socioeconomic History  . Marital status: Single    Spouse name: Not on file  . Number of children: 1  . Years of education: college  . 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  Smoking status: Never Smoker  . Smokeless tobacco: Never Used  Substance and Sexual Activity  . Alcohol use: Not Currently    Comment: 01/20/20 1-2 weekly  . Drug use: Not Currently  . Sexual activity: Not on file  Other Topics Concern  . Not on file  Social History Narrative    Caffeine coffee 1 c   Social Determinants of Health   Financial Resource Strain:   . Difficulty of Paying Living Expenses:   Food Insecurity:   . Worried About Programme researcher, broadcasting/film/video in the Last Year:   . Barista in the Last Year:   Transportation Needs:   . Freight forwarder (Medical):   Marland Kitchen Lack of Transportation  (Non-Medical):   Physical Activity:   . Days of Exercise per Week:   . Minutes of Exercise per Session:   Stress:   . Feeling of Stress :   Social Connections:   . Frequency of Communication with Friends and Family:   . Frequency of Social Gatherings with Friends and Family:   . Attends Religious Services:   . Active Member of Clubs or Organizations:   . Attends Banker Meetings:   Marland Kitchen Marital Status:   Intimate Partner Violence:   . Fear of Current or Ex-Partner:   . Emotionally Abused:   Marland Kitchen Physically Abused:   . Sexually Abused:      PHYSICAL EXAM  GENERAL EXAM/CONSTITUTIONAL: Vitals:  Vitals:   01/20/20 1236  BP: 113/60  Pulse: (!) 55  Temp: (!) 97.2 F (36.2 C)  Weight: 171 lb 9.6 oz (77.8 kg)  Height: 5\' 9"  (1.753 m)     Body mass index is 25.34 kg/m. Wt Readings from Last 3 Encounters:  01/20/20 171 lb 9.6 oz (77.8 kg)  08/24/18 160 lb (72.6 kg)     Patient is in no distress; well developed, nourished and groomed; neck is supple  CARDIOVASCULAR:  Examination of carotid arteries is normal; no carotid bruits  Regular rate and rhythm, no murmurs  Examination of peripheral vascular system by observation and palpation is normal  EYES:  Ophthalmoscopic exam of optic discs and posterior segments is normal; no papilledema or hemorrhages  No exam data present  MUSCULOSKELETAL:  Gait, strength, tone, movements noted in Neurologic exam below  NEUROLOGIC: MENTAL STATUS:  No flowsheet data found.  awake, alert, oriented to person, place and time  recent and remote memory intact  normal attention and concentration  language fluent, comprehension intact, naming intact  fund of knowledge appropriate  CRANIAL NERVE:   2nd - no papilledema on fundoscopic exam  2nd, 3rd, 4th, 6th - pupils equal and reactive to light, visual fields full to confrontation, extraocular muscles intact, no nystagmus  5th - facial sensation symmetric  7th -  facial strength symmetric  8th - hearing intact  9th - palate elevates symmetrically, uvula midline  11th - shoulder shrug symmetric  12th - tongue protrusion midline  MOTOR:   normal bulk and tone, full strength in the BUE, BLE  SENSORY:   normal and symmetric to light touch, temperature, vibration  COORDINATION:   finger-nose-finger, fine finger movements normal  REFLEXES:   deep tendon reflexes present and symmetric  GAIT/STATION:   narrow based gait     DIAGNOSTIC DATA (LABS, IMAGING, TESTING) - I reviewed patient records, labs, notes, testing and imaging myself where available.  No results found for: WBC, HGB, HCT, MCV, PLT No results found for: NA, K, CL, CO2,  GLUCOSE, BUN, CREATININE, CALCIUM, PROT, ALBUMIN, AST, ALT, ALKPHOS, BILITOT, GFRNONAA, GFRAA No results found for: CHOL, HDL, LDLCALC, LDLDIRECT, TRIG, CHOLHDL No results found for: HGBA1C No results found for: VITAMINB12 No results found for: TSH      ASSESSMENT AND PLAN  49 y.o. year old male here with new onset daily headaches since December 2020, without triggering or aggravating factors.  Will check MRI of the brain to rule out any other secondary causes.  Dx:  1. New daily persistent headache      PLAN:  NEW ONSET DAILY HEADACHES (? Tension vs other secondary HA) - check MRI brain  - To prevent or relieve headaches, try the following:  Cool Compress. Lie down and place a cool compress on your head.   Avoid headache triggers. If certain foods or odors seem to have triggered your migraines in the past, avoid them. A headache diary might help you identify triggers.   Include physical activity in your daily routine.   Manage stress. Find healthy ways to cope with the stressors, such as delegating tasks on your to-do list.   Practice relaxation techniques. Try deep breathing, yoga, massage and visualization.   Eat regularly. Eating regularly scheduled meals and maintaining a  healthy diet might help prevent headaches. Also, drink plenty of fluids.   Follow a regular sleep schedule. Sleep deprivation might contribute to headaches  Consider biofeedback. With this mind-body technique, you learn to control certain bodily functions -- such as muscle tension, heart rate and blood pressure -- to prevent headaches or reduce headache pain.  Orders Placed This Encounter  Procedures  . MR BRAIN W WO CONTRAST   Return pending test results, for pending if symptoms worsen or fail to improve.    Penni Bombard, MD 06/30/5175, 1:60 PM Certified in Neurology, Neurophysiology and Neuroimaging  [facility] [address], [place], [place] [address] [phone]

## 2020-01-26 ENCOUNTER — Telehealth: Payer: Self-pay | Admitting: Diagnostic Neuroimaging

## 2020-01-26 NOTE — Telephone Encounter (Signed)
LVM for pt to call back about scheduling mri  BCBS Auth: [id number] (exp. 01/26/20 to 07/23/20)

## 2020-01-26 NOTE — Telephone Encounter (Signed)
Patient is scheduled at [facility] for 01/28/20.

## 2020-01-28 ENCOUNTER — Other Ambulatory Visit: Payer: Self-pay

## 2020-01-28 ENCOUNTER — Ambulatory Visit: Payer: BC Managed Care – PPO

## 2020-01-28 DIAGNOSIS — G4452 New daily persistent headache (NDPH): Secondary | ICD-10-CM | POA: Diagnosis not present

## 2020-01-28 MED ORDER — GADOBENATE DIMEGLUMINE 529 MG/ML IV SOLN
15.0000 mL | Freq: Once | INTRAVENOUS | Status: AC | PRN
  Administered 2020-01-28: 15 mL via INTRAVENOUS

## 2020-02-02 ENCOUNTER — Telehealth: Payer: Self-pay | Admitting: Diagnostic Neuroimaging

## 2020-02-02 NOTE — Telephone Encounter (Signed)
Called patient and LVM- his MRI brain is normal. Advised he monitor symptoms, call for any questions. Left #.

## 2020-02-02 NOTE — Telephone Encounter (Signed)
Pt called to get an update on his MRI results requested a CB today if possible states he will be out of town this week traveling and it will be harder to reach him

## 2020-08-21 DIAGNOSIS — T797XXA Traumatic subcutaneous emphysema, initial encounter: Secondary | ICD-10-CM | POA: Diagnosis not present

## 2020-08-21 DIAGNOSIS — S270XXA Traumatic pneumothorax, initial encounter: Secondary | ICD-10-CM | POA: Diagnosis not present

## 2020-08-21 DIAGNOSIS — S80811A Abrasion, right lower leg, initial encounter: Secondary | ICD-10-CM | POA: Diagnosis not present

## 2020-08-21 DIAGNOSIS — S27809A Unspecified injury of diaphragm, initial encounter: Secondary | ICD-10-CM | POA: Diagnosis not present

## 2020-08-21 DIAGNOSIS — R58 Hemorrhage, not elsewhere classified: Secondary | ICD-10-CM | POA: Diagnosis not present

## 2020-08-21 DIAGNOSIS — R0689 Other abnormalities of breathing: Secondary | ICD-10-CM | POA: Diagnosis not present

## 2020-08-21 DIAGNOSIS — S27321A Contusion of lung, unilateral, initial encounter: Secondary | ICD-10-CM | POA: Diagnosis not present

## 2020-08-21 DIAGNOSIS — S1989XA Other specified injuries of other specified part of neck, initial encounter: Secondary | ICD-10-CM | POA: Diagnosis not present

## 2020-08-21 DIAGNOSIS — S098XXA Other specified injuries of head, initial encounter: Secondary | ICD-10-CM | POA: Diagnosis not present

## 2020-08-21 DIAGNOSIS — S01312A Laceration without foreign body of left ear, initial encounter: Secondary | ICD-10-CM | POA: Diagnosis not present

## 2020-08-21 DIAGNOSIS — S1980XA Other specified injuries of unspecified part of neck, initial encounter: Secondary | ICD-10-CM | POA: Diagnosis not present

## 2020-08-21 DIAGNOSIS — R0902 Hypoxemia: Secondary | ICD-10-CM | POA: Diagnosis not present

## 2020-08-21 DIAGNOSIS — S3981XA Other specified injuries of abdomen, initial encounter: Secondary | ICD-10-CM | POA: Diagnosis not present

## 2020-08-21 DIAGNOSIS — S50312A Abrasion of left elbow, initial encounter: Secondary | ICD-10-CM | POA: Diagnosis not present

## 2020-08-21 DIAGNOSIS — S80211A Abrasion, right knee, initial encounter: Secondary | ICD-10-CM | POA: Diagnosis not present

## 2020-08-21 DIAGNOSIS — E875 Hyperkalemia: Secondary | ICD-10-CM | POA: Diagnosis not present

## 2020-08-21 DIAGNOSIS — S3983XA Other specified injuries of pelvis, initial encounter: Secondary | ICD-10-CM | POA: Diagnosis not present

## 2020-08-21 DIAGNOSIS — J939 Pneumothorax, unspecified: Secondary | ICD-10-CM | POA: Diagnosis not present

## 2020-08-21 DIAGNOSIS — S2241XA Multiple fractures of ribs, right side, initial encounter for closed fracture: Secondary | ICD-10-CM | POA: Diagnosis not present

## 2020-08-21 DIAGNOSIS — S30811A Abrasion of abdominal wall, initial encounter: Secondary | ICD-10-CM | POA: Diagnosis not present

## 2020-08-21 DIAGNOSIS — Z20822 Contact with and (suspected) exposure to covid-19: Secondary | ICD-10-CM | POA: Diagnosis not present

## 2020-08-21 DIAGNOSIS — Z23 Encounter for immunization: Secondary | ICD-10-CM | POA: Diagnosis not present

## 2020-08-21 DIAGNOSIS — R52 Pain, unspecified: Secondary | ICD-10-CM | POA: Diagnosis not present

## 2020-08-21 DIAGNOSIS — S80212A Abrasion, left knee, initial encounter: Secondary | ICD-10-CM | POA: Diagnosis not present

## 2020-08-21 DIAGNOSIS — S42101A Fracture of unspecified part of scapula, right shoulder, initial encounter for closed fracture: Secondary | ICD-10-CM | POA: Diagnosis not present

## 2020-09-03 DIAGNOSIS — S42101D Fracture of unspecified part of scapula, right shoulder, subsequent encounter for fracture with routine healing: Secondary | ICD-10-CM | POA: Diagnosis not present

## 2020-09-03 DIAGNOSIS — S2241XD Multiple fractures of ribs, right side, subsequent encounter for fracture with routine healing: Secondary | ICD-10-CM | POA: Diagnosis not present

## 2020-09-03 DIAGNOSIS — S42101A Fracture of unspecified part of scapula, right shoulder, initial encounter for closed fracture: Secondary | ICD-10-CM | POA: Diagnosis not present

## 2020-09-07 DIAGNOSIS — M25511 Pain in right shoulder: Secondary | ICD-10-CM | POA: Diagnosis not present

## 2020-09-09 ENCOUNTER — Other Ambulatory Visit: Payer: Self-pay | Admitting: Orthopedic Surgery

## 2020-09-09 DIAGNOSIS — S42114D Nondisplaced fracture of body of scapula, right shoulder, subsequent encounter for fracture with routine healing: Secondary | ICD-10-CM

## 2020-09-10 ENCOUNTER — Other Ambulatory Visit: Payer: BLUE CROSS/BLUE SHIELD

## 2020-09-10 ENCOUNTER — Ambulatory Visit
Admission: RE | Admit: 2020-09-10 | Discharge: 2020-09-10 | Disposition: A | Discharge status: Home / Self Care | Payer: BC Managed Care – PPO | Source: Ambulatory Visit | Attending: Orthopedic Surgery | Admitting: Orthopedic Surgery

## 2020-09-10 ENCOUNTER — Other Ambulatory Visit: Payer: Self-pay

## 2020-09-10 DIAGNOSIS — M19011 Primary osteoarthritis, right shoulder: Secondary | ICD-10-CM | POA: Diagnosis not present

## 2020-09-10 DIAGNOSIS — S42114D Nondisplaced fracture of body of scapula, right shoulder, subsequent encounter for fracture with routine healing: Secondary | ICD-10-CM

## 2020-09-10 DIAGNOSIS — S42111A Displaced fracture of body of scapula, right shoulder, initial encounter for closed fracture: Secondary | ICD-10-CM | POA: Diagnosis not present

## 2020-09-21 ENCOUNTER — Ambulatory Visit: Payer: BC Managed Care – PPO | Attending: Internal Medicine

## 2020-09-21 DIAGNOSIS — Z23 Encounter for immunization: Secondary | ICD-10-CM

## 2020-09-21 NOTE — Progress Notes (Signed)
   Covid-19 Vaccination Clinic  Name:  Keagen Heinlen    MRN: [id number] DOB: August 21, 1971  09/21/2020  Mr. Fofana was observed post Covid-19 immunization for 15 minutes without incident. He was provided with Vaccine Information Sheet and instruction to access the V-Safe system.   Mr. Dollens was instructed to call 911 with any severe reactions post vaccine: Marland Kitchen Difficulty breathing  . Swelling of face and throat  . A fast heartbeat  . A bad rash all over body  . Dizziness and weakness   Immunizations Administered    Name Date Dose VIS Date Route   Pfizer COVID-19 Vaccine 09/21/2020  3:51 PM 0.3 mL 08/18/2020 Intramuscular   Manufacturer: [employer], [employer]   Lot: [id number]   NDC: [address]-8

## 2020-11-03 DIAGNOSIS — Z20822 Contact with and (suspected) exposure to covid-19: Secondary | ICD-10-CM | POA: Diagnosis not present

## 2021-03-02 DIAGNOSIS — F330 Major depressive disorder, recurrent, mild: Secondary | ICD-10-CM | POA: Diagnosis not present

## 2021-05-26 LAB — COLOGUARD: COLOGUARD: NEGATIVE

## 2021-06-05 IMAGING — CT CT SHOULDER*R* W/O CM
1 series · 12 of 14 positions shown, 15 images · non-contrast
Comparison: None.

CLINICAL DATA: Right shoulder pain, right scapular fracture with
severe pain.

EXAM:
CT OF THE UPPER RIGHT EXTREMITY WITHOUT CONTRAST
TECHNIQUE: Multidetector CT imaging of the upper right extremity was performed
according to the standard protocol.

[Series 3: soft tissue · axial · 0.58mm/px · z∈[-239,-55]mm · 12 of 110 slices shown, 15 images]
[im 9/110  soft-tissue]
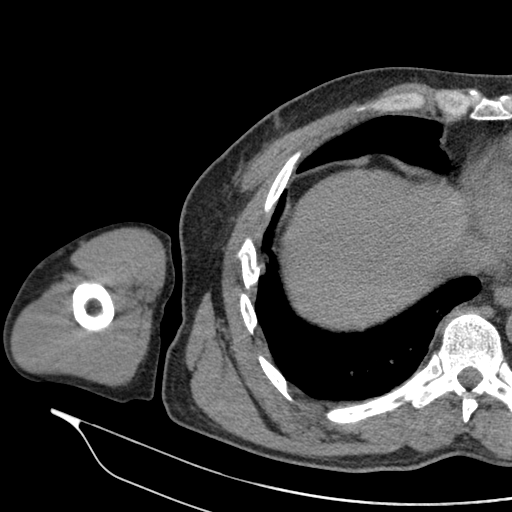
[im 9/110  bone]
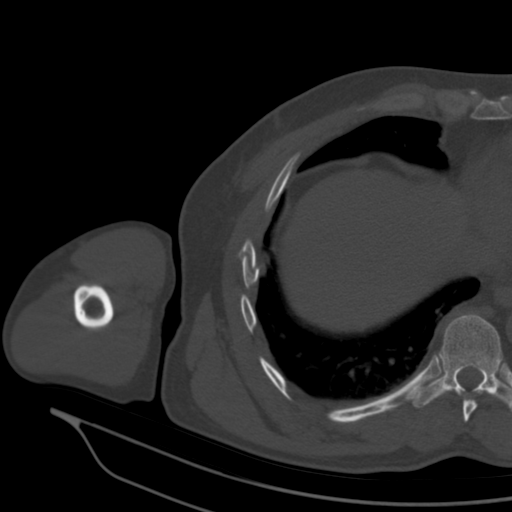
[im 17/110  bone]
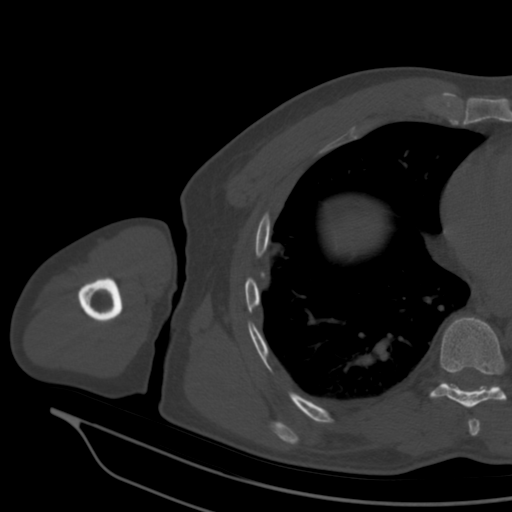
[im 26/110  bone]
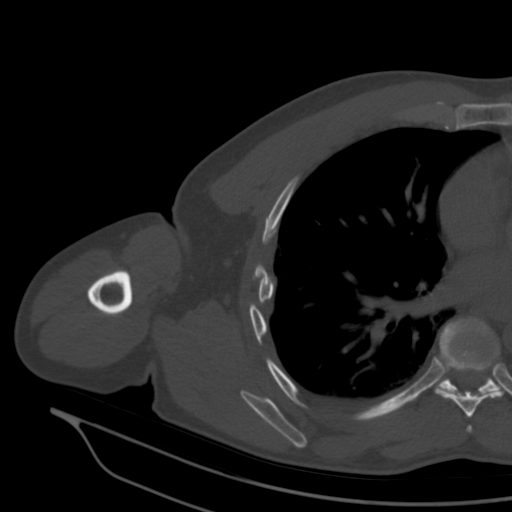
[im 34/110  bone]
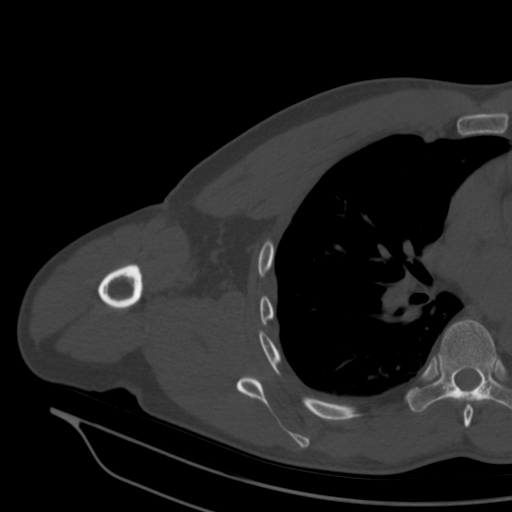
[im 42/110  soft-tissue]
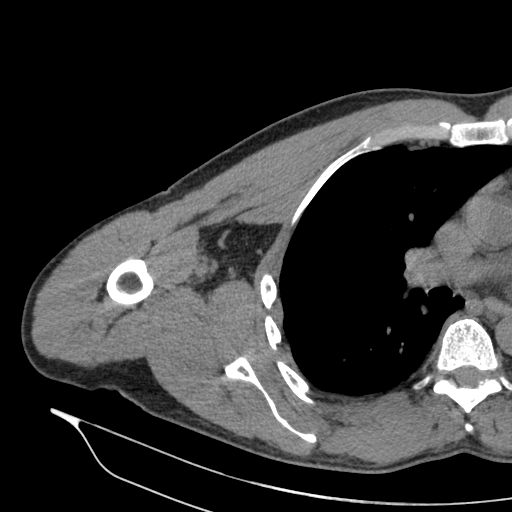
[im 42/110  bone]
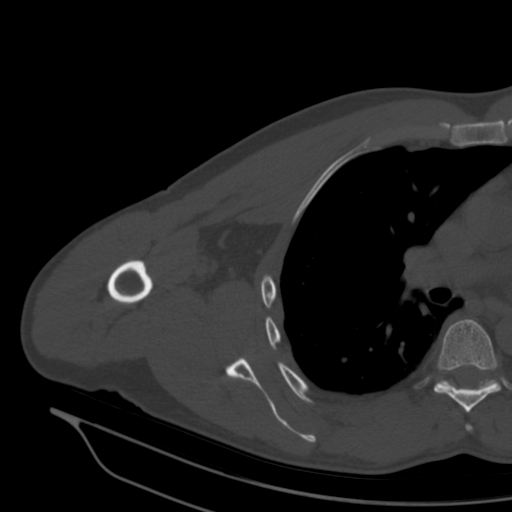
[im 51/110  bone]
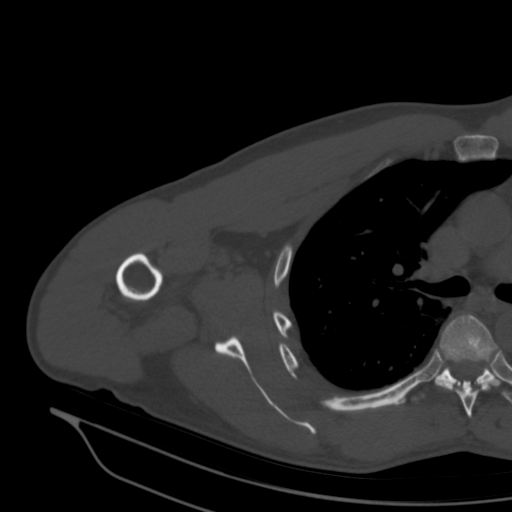
[im 59/110  bone]
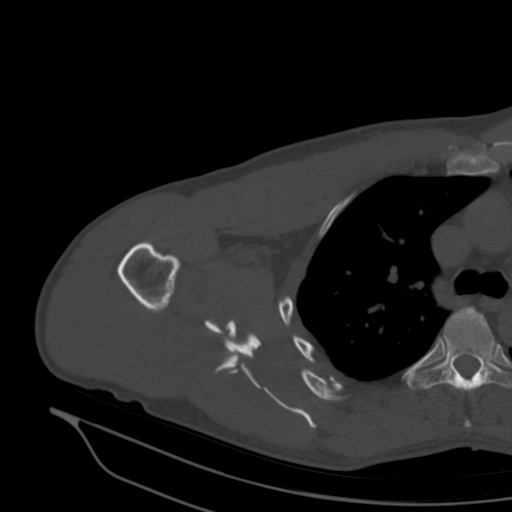
[im 68/110  bone]
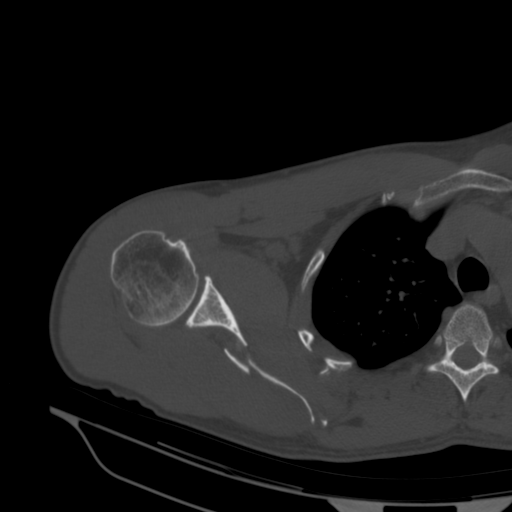
[im 76/110  soft-tissue]
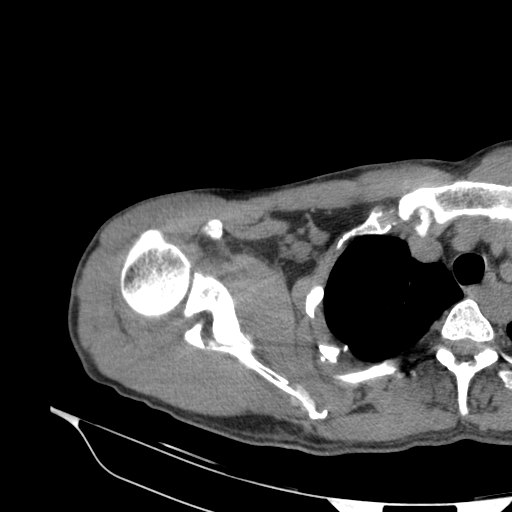
[im 76/110  bone]
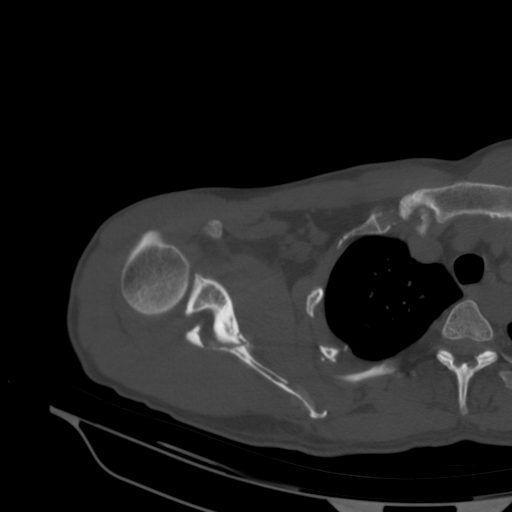
[im 84/110  bone]
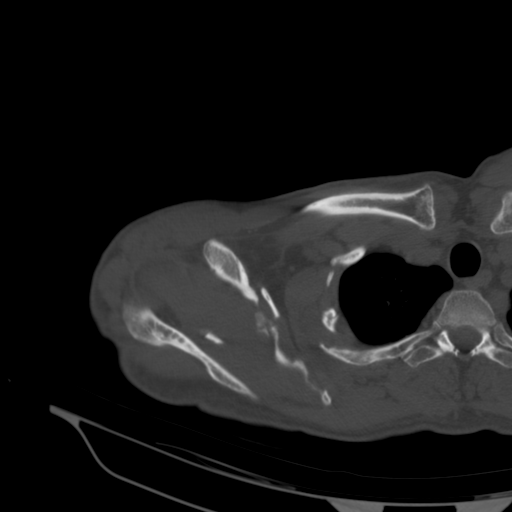
[im 93/110  bone]
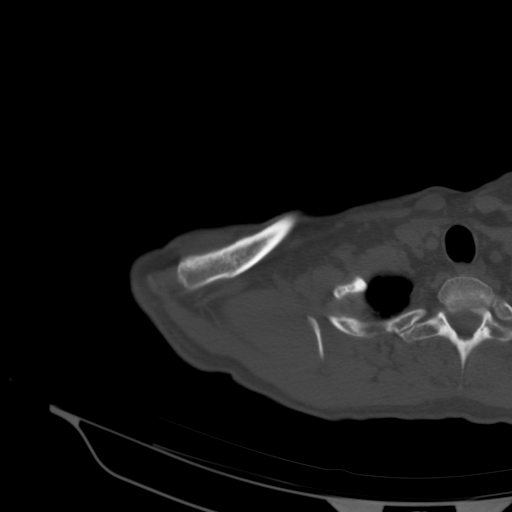
[im 101/110  bone]
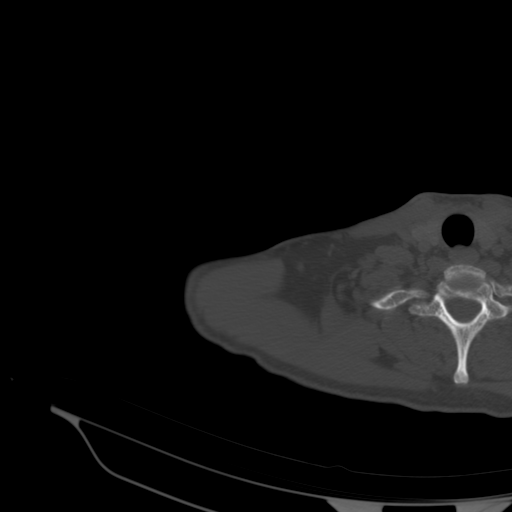

[12 of 14 positions shown; findings below may reference images not displayed]

FINDINGS: Bones/Joint/Cartilage

Severe comminuted fracture of the body scapula with mild callus
formation. Displaced fracture of the base of the acromion with 10 mm
of posterior displacement. Fracture does not involve the glenoid.
Fracture does not extend into the spinal glenoid notch or
suprascapular notch. Nondisplaced fracture of the right lateral
first rib. Comminuted fracture of the right lateral second rib.
Comminuted fracture of the right lateral third rib. Comminuted
fracture of the right posterolateral fourth and fifth ribs.
Nondisplaced fracture of the right posterior sixth rib. Nondisplaced
fracture of the right anterolateral fourth rib. Mildly displaced
healing fracture of the right lateral fifth rib and sixth rib. No
other fracture or dislocation. Osteoarthritis of the right
sternoclavicular joint. Glenohumeral joint space is maintained.

Ligaments

Ligaments are suboptimally evaluated by CT.

Muscles and Tendons
Muscles are normal. No muscle atrophy. No intramuscular fluid
collection or hematoma.

Soft tissue
No fluid collection or hematoma. No soft tissue mass. Visualized
right lung demonstrates no focal abnormality.
IMPRESSION: 1. Severe comminuted fracture of the body scapula with mild callus
formation. Displaced fracture of the base of the acromion with 10 mm
of posterior displacement. Fracture does not extend into the
glenoid, spinoglenoid notch or suprascapular notch. Numerous right
rib fractures as detailed above.
2.

## 2022-12-06 ENCOUNTER — Ambulatory Visit (INDEPENDENT_AMBULATORY_CARE_PROVIDER_SITE_OTHER): Payer: Self-pay | Admitting: Plastic Surgery

## 2022-12-06 VITALS — BP 128/78 | HR 56 | Ht 69.0 in | Wt 169.6 lb

## 2022-12-06 DIAGNOSIS — R238 Other skin changes: Secondary | ICD-10-CM

## 2022-12-06 NOTE — Progress Notes (Signed)
   Referring Provider Rankins, Bill Salinas, MD [place],  [place] [address]   CC:  Chief Complaint  Patient presents with   Consult      Noah Collins is an 52 y.o. male.  HPI: Mr. Noah Collins is a 52 year old male who presents today for discussion of a neck lift.  He states that he was wearing a high neck shirt and his daughter pointed out that he had excess skin on his chin.  He is asking if anything or what can be done about this excess skin.  He is otherwise healthy.  No Known Allergies  Outpatient Encounter Medications as of 12/06/2022  Medication Sig   citalopram (CELEXA) 20 MG tablet Take 20 mg by mouth daily.   Multiple Vitamin (MULTIVITAMIN WITH MINERALS) TABS tablet Take 1 tablet by mouth daily.   No facility-administered encounter medications on file as of 12/06/2022.     Past Medical History:  Diagnosis Date   Depression with anxiety    Hemorrhoid    hx of   New daily persistent headache     Past Surgical History:  Procedure Laterality Date   KNEE ARTHROSCOPY Left 2020   meniscus   LASIK  2002   SHOULDER ARTHROSCOPY  2018   VASECTOMY  10/2016   WISDOM TOOTH EXTRACTION      Family History  Problem Relation Age of Onset   Hypertension Mother    Anxiety disorder Mother    Alzheimer's disease Maternal Grandmother    Alzheimer's disease Paternal Grandmother    Lung cancer Paternal Uncle     Social History   Social History Narrative    Caffeine coffee 1 c     Review of Systems General: Denies fevers, chills, weight loss CV: Denies chest pain, shortness of breath, palpitations Skin: Excess skin at the cervical mental angle.  Physical Exam    12/06/2022   10:23 AM 01/20/2020   12:36 PM 09/05/2018    2:01 PM  Vitals with BMI  Height 5\' 9"  5\' 9"    Weight 169 lbs 10 oz 171 lbs 10 oz   BMI 14.78 29.56   Systolic 213 086   Diastolic 78 60   Pulse 56 55      Information is confidential and restricted. Go to Review Flowsheets to unlock data.     General:  No acute distress,  Alert and oriented, Non-Toxic, Normal speech and affect Integument: The patient has overall very good skin with very few rhytids.  He has no gelling and has a very minimal loss of the cervical mental angle.   Assessment/Plan Facial aging: The patient has a minimal blunting of the cervical mental angle.  I do not believe that he would benefit from a neck lift at this time.  As he continues to age it would be very reasonable to consider a mid facelift along with a neck lift but I think that this is several years down the road and discussed this with him.  If he has worsening fine rhytids on the face he might certainly be a candidate for laser therapy and we discussed this as well.  He is not interested in this at this time.  Follow-up as needed  Camillia Herter 12/06/2022, 10:46 AM

## 2023-01-03 ENCOUNTER — Encounter: Payer: BC Managed Care – PPO | Admitting: Plastic Surgery
